# Patient Record
Sex: Male | Born: 1960 | Race: Black or African American | Hispanic: No | Marital: Single | Smoking: Never smoker
Health system: Southern US, Community
[De-identification: ages and names within clinical notes are randomized; demographics above are authoritative.]

## PROBLEM LIST (undated history)

## (undated) DIAGNOSIS — K219 Gastro-esophageal reflux disease without esophagitis: Secondary | ICD-10-CM

## (undated) DIAGNOSIS — I10 Essential (primary) hypertension: Secondary | ICD-10-CM

## (undated) DIAGNOSIS — G4730 Sleep apnea, unspecified: Secondary | ICD-10-CM

## (undated) HISTORY — DX: Gastro-esophageal reflux disease without esophagitis: K21.9

## (undated) HISTORY — DX: Essential (primary) hypertension: I10

---

## 2012-11-03 ENCOUNTER — Encounter: Payer: Self-pay | Admitting: Gastroenterology

## 2012-11-15 ENCOUNTER — Ambulatory Visit (INDEPENDENT_AMBULATORY_CARE_PROVIDER_SITE_OTHER): Payer: PRIVATE HEALTH INSURANCE | Admitting: Gastroenterology

## 2012-11-15 ENCOUNTER — Encounter: Payer: Self-pay | Admitting: Gastroenterology

## 2012-11-15 VITALS — BP 130/84 | HR 88 | Ht 68.5 in | Wt 221.4 lb

## 2012-11-15 DIAGNOSIS — R1310 Dysphagia, unspecified: Secondary | ICD-10-CM

## 2012-11-15 DIAGNOSIS — R066 Hiccough: Secondary | ICD-10-CM

## 2012-11-15 DIAGNOSIS — R197 Diarrhea, unspecified: Secondary | ICD-10-CM

## 2012-11-15 DIAGNOSIS — K219 Gastro-esophageal reflux disease without esophagitis: Secondary | ICD-10-CM

## 2012-11-15 MED ORDER — DEXLANSOPRAZOLE 60 MG PO CPDR: 60.0000 mg | DELAYED_RELEASE_CAPSULE | Freq: Every day | ORAL | Status: DC

## 2012-11-15 MED ORDER — HYOSCYAMINE SULFATE 0.125 MG SL SUBL: 0.1250 mg | SUBLINGUAL_TABLET | SUBLINGUAL | Status: DC | PRN

## 2012-11-15 NOTE — Patient Instructions (Addendum)
You have been scheduled for an endoscopy with propofol. Please follow written instructions given to you at your visit today. If you use inhalers (even only as needed), please bring them with you on the day of your procedure. Your physician has requested that you go to www.startemmi.com and enter the access code given to you at your visit today. This web site gives a general overview about your procedure. However, you should still follow specific instructions given to you by our office regarding your preparation for the procedure.  You watched a video today on acid reflux and information is below.  We have sent the following medications to your pharmacy for you to pick up at your convenience: Dexilant 60 mg, please take one capsule by mouth thirty minutes before breakfast once daily. Levsin, please take as directed   You have been scheduled for an esophageal manometry at [facility] on 11-28-12 at 9:15 am. Please arrive 30 minutes prior to your procedure for registration. You will need to go to outpatient registration (1st floor of the hospital) first. Make certain to bring your insurance cards as well as a complete list of medications.  Please remember the following:  1) Nothing to eat or drink after 12:00 midnight on the night before your test.  2) Hold all diabetic medications/insulin the morning of the test. You may eat and take  your medications after the test.  3) For 3 days prior to your test do not take: Dexilant, Prevacid, Nexium, Protonix,  Aciphex, Zegerid, Pantoprazole, Prilosec or omeprazole.  4) For 2 days prior to your test, do not take: Reglan, Tagamet, Zantac, Axid or Pepcid.  5) You MAY use an antacid such as Rolaids or Tums up to 12 hours prior to your test.  It will take at least 2 weeks to receive the results of this test from your physician. ------------------------------------------ ABOUT ESOPHAGEAL MANOMETRY Esophageal manometry (muh-NOM-uh-tree) is a test  that gauges how well your esophagus works. Your esophagus is the long, muscular tube that connects your throat to your stomach. Esophageal manometry measures the rhythmic muscle contractions (peristalsis) that occur in your esophagus when you swallow. Esophageal manometry also measures the coordination and force exerted by the muscles of your esophagus.  During esophageal manometry, a thin, flexible tube (catheter) that contains sensors is passed through your nose, down your esophagus and into your stomach. Esophageal manometry can be helpful in diagnosing some mostly uncommon disorders that affect your esophagus.  Why it's done Esophageal manometry is used to evaluate the movement (motility) of food through the esophagus and into the stomach. The test measures how well the circular bands of muscle (sphincters) at the top and bottom of your esophagus open and close, as well as the pressure, strength and pattern of the wave of esophageal muscle contractions that moves food along.  What you can expect Esophageal manometry is an outpatient procedure done without sedation. Most people tolerate it well. You may be asked to change into a hospital gown before the test starts.  During esophageal manometry  While you are sitting up, a member of your health care team sprays your throat with a numbing medication or puts numbing gel in your nose or both.  A catheter is guided through your nose into your esophagus. The catheter may be sheathed in a water-filled sleeve. It doesn't interfere with your breathing. However, your eyes may water, and you may gag. You may have a slight nosebleed from irritation.  After the catheter is in  place, you may be asked to lie on your back on an exam table, or you may be asked to remain seated.  You then swallow small sips of water. As you do, a computer connected to the catheter records the pressure, strength and pattern of your esophageal muscle contractions.  During the test,  you'll be asked to breathe slowly and smoothly, remain as still as possible, and swallow only when you're asked to do so.  A member of your health care team may move the catheter down into your stomach while the catheter continues its measurements.  The catheter then is slowly withdrawn. The test usually lasts 20 to 30 minutes.  After esophageal manometry  When your esophageal manometry is complete, you may return to your normal activities  This test typically takes 30-45 minutes to complete. ________________________________________________________________________________  Brent Mcconnell have been scheduled for a Barium Esophogram at [facility] (1st floor of the hospital) on 11-18-2012 at 10 am. Please arrive 15 minutes prior to your appointment for registration. Make certain not to have anything to eat or drink 6 hours prior to your test. If you need to reschedule for any reason, please contact radiology at [phone] to do so. __________________________________________________________________ A barium swallow is an examination that concentrates on views of the esophagus. This tends to be a double contrast exam (barium and two liquids which, when combined, create a gas to distend the wall of the oesophagus) or single contrast (non-ionic iodine based). The study is usually tailored to your symptoms so a good history is essential. Attention is paid during the study to the form, structure and configuration of the esophagus, looking for functional disorders (such as aspiration, dysphagia, achalasia, motility and reflux) EXAMINATION You may be asked to change into a gown, depending on the type of swallow being performed. A radiologist and radiographer will perform the procedure. The radiologist will advise you of the type of contrast selected for your procedure and direct you during the exam. You will be asked to stand, sit or lie in several different positions and to hold a small amount of fluid in your  mouth before being asked to swallow while the imaging is performed .In some instances you may be asked to swallow barium coated marshmallows to assess the motility of a solid food bolus. The exam can be recorded as a digital or video fluoroscopy procedure. POST PROCEDURE It will take 1-2 days for the barium to pass through your system. To facilitate this, it is important, unless otherwise directed, to increase your fluids for the next 24-48hrs and to resume your normal diet.  This test typically takes about 30 minutes to perform. __________________________________________________________________________________   _______________________________________________________________________________________________________________  Diet for Gastroesophageal Reflux Disease, Adult Reflux (acid reflux) is when acid from your stomach flows up into the esophagus. When acid comes in contact with the esophagus, the acid causes irritation and soreness (inflammation) in the esophagus. When reflux happens often or so severely that it causes damage to the esophagus, it is called gastroesophageal reflux disease (GERD). Nutrition therapy can help ease the discomfort of GERD. FOODS OR DRINKS TO AVOID OR LIMIT  Smoking or chewing tobacco. Nicotine is one of the most potent stimulants to acid production in the gastrointestinal tract.  Caffeinated and decaffeinated coffee and black tea.  Regular or low-calorie carbonated beverages or energy drinks (caffeine-free carbonated beverages are allowed).   Strong spices, such as black pepper, white pepper, red pepper, cayenne, curry powder, and chili powder.  Peppermint or spearmint.  Chocolate.  High-fat foods, including meats and fried foods. Extra added fats including oils, butter, salad dressings, and nuts. Limit these to less than 8 tsp per day.  Fruits and vegetables if they are not tolerated, such as citrus fruits or tomatoes.  Alcohol.  Any food that seems  to aggravate your condition. If you have questions regarding your diet, call your caregiver or a registered dietitian. OTHER THINGS THAT MAY HELP GERD INCLUDE:   Eating your meals slowly, in a relaxed setting.  Eating 5 to 6 small meals per day instead of 3 large meals.  Eliminating food for a period of time if it causes distress.  Not lying down until 3 hours after eating a meal.  Keeping the head of your bed raised 6 to 9 inches (15 to 23 cm) by using a foam wedge or blocks under the legs of the bed. Lying flat may make symptoms worse.  Being physically active. Weight loss may be helpful in reducing reflux in overweight or obese adults.  Wear loose fitting clothing EXAMPLE MEAL PLAN This meal plan is approximately 2,000 calories based on https://www.[facility].org/ meal planning guidelines. Breakfast   cup cooked oatmeal.  1 cup strawberries.  1 cup low-fat milk.  1 oz almonds. Snack  1 cup cucumber slices.  6 oz yogurt (made from low-fat or fat-free milk). Lunch  2 slice whole-wheat bread.  2 oz sliced [place].  2 tsp mayonnaise.  1 cup blueberries.  1 cup snap peas. Snack  6 whole-wheat crackers.  1 oz string cheese. Dinner   cup brown rice.  1 cup mixed veggies.  1 tsp olive oil.  3 oz grilled fish. Document Released: 04/20/2005 Document Revised: 07/13/2011 Document Reviewed: 03/06/2011 ExitCare Patient Information 2014 [employer]. __________________________________________________________________________________________________________________                                               We are excited to introduce MyChart, a new best-in-class service that provides you online access to important information in your electronic medical record. We want to make it easier for you to view your health information - all in one secure location - when and where you need it. We expect MyChart will enhance the quality of care and service we provide.  When you  register for MyChart, you can:    View your test results.    Request appointments and receive appointment reminders via email.    Request medication renewals.    View your medical history, allergies, medications and immunizations.    Communicate with your physician's office through a password-protected site.    Conveniently print information such as your medication lists.  To find out if MyChart is right for you, please talk to a member of our clinical staff today. We will gladly answer your questions about this free health and wellness tool.  If you are age 7 or older and want a member of your family to have access to your record, you must provide written consent by completing a proxy form available at our office. Please speak to our clinical staff about guidelines regarding accounts for patients younger than age 65.  As you activate your MyChart account and need any technical assistance, please call the MyChart technical support line at [phone]) or email your question to [email or web address]. If you email your question(s), please include your name, a  return phone number and the best time to reach you.  If you have non-urgent health-related questions, you can send a message to our office through MyChart at [email or web address]. If you have a medical emergency, call 911.  Thank you for using MyChart as your new health and wellness resource!   MyChart licensed from [employer],  [id number]. Patents Pending.

## 2012-11-15 NOTE — Progress Notes (Signed)
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  He has had a 2 year history of belching, burping, and acid reflux all as result of an episode of possible viral gastroenteritis onl 08/05/2010.  At that time he had nausea and vomiting and diarrhea, and has  had subsequent acid reflux refractory to standard PPI therapy.  He apparently underwent extensive evaluation in [place] including barium swallow, and endoscopy, and apparently was told that he had a hiatal hernia.  His current problem is solid food dysphagia  with every meal and with associated hiccups and regurgitation in the mid substernal area.  Despite these symptoms, he has had no anorexia, weight loss, and denies problems with liquids.  He does not have symptoms if he does not eat.  He currently is on over-the-counter Nexium with some control of his acidity.  He also relates that after a meal, he has rather urgent diarrhea without abdominal pain, melena or hematochezia..  Allegedly he had a negative colonoscopy 5 years ago in [place].  I have none of these records for review.  The patient relates that from October to March of last year he was free of symptoms when he was taking over-the-counter Prevacid.  He has no neuromuscular problems or other neurological issues, chronic headaches, associated skin rashes, arthritis, or any ENT problems.  He apparently has been emergency room on several occasions in the past with refractory hiccups.  I have reviewed this patient's present history, medical and surgical past history, allergies and medications.     ROS:   All systems were reviewed and are negative unless otherwise stated in the HPI.  No Known Allergies No outpatient prescriptions prior to visit.   No facility-administered medications prior to visit.   Past Medical History  Diagnosis Date  . GERD (gastroesophageal reflux disease)   . Hypertension     History reviewed. No pertinent past surgical history. History   Social History  . Marital Status: Legally Separated    Spouse Name: N/A    Number of Children: 4  . Years of Education: N/A   Occupational History  . Retired    Social History Main Topics  . Smoking status: Never Smoker   . Smokeless tobacco: Never Used  . Alcohol Use: No  . Drug Use: No  . Sexually Active: None   Other Topics Concern  . None   Social History Narrative  . None   Family History  Problem Relation Age of Onset  . Diabetes Mother   . Heart disease Mother   . Diabetes Father   . Heart disease Father        Physical Exam: Very strong and physically fit-appearing patient in no distress.  Blood pressure 130/84, pulse 88, and weight 221 with a BMI of 33.17.  Examination oral pharyngeal area shows some edema in the retropharyngeal area.  Examination the neck is otherwise unremarkable. General well developed well nourished patient in no acute distress, appearing their stated age Eyes PERRLA, no icterus, fundoscopic exam per opthamologist Skin no lesions noted Neck supple, no adenopathy, no thyroid enlargement, no tenderness Chest clear to percussion and auscultation Heart no significant murmurs, gallops or rubs noted Abdomen no hepatosplenomegaly masses or tenderness, BS normal.  Extremities no acute joint lesions, edema, phlebitis or evidence of cellulitis. Neurologic patient oriented x 3, cranial nerves intact, no focal neurologic deficits noted. Psychological mental status normal and normal affect.  Assessment  and plan: Probable severe acid reflux with associated peptic stricture the esophagus versus an esophageal motility disorder.  He did have a succussion splash noted on physical exam today.  I have placed him on stronger acid suppression with Dexilant 60 mg every morning, and leading try when necessary sublingual Levsin.  I've schedule barium swallow, endoscopy, an esophageal  high-resolution manometry.  The patient relates his labs are been checked frequently he has had no evidence of liver function test abnormalities.  He denies use of alcohol, cigarettes, or steroids or other bodybuilding supplements.  Besides his GERDproblems, he has no other medical complaints except for vague history of possible sleep apnea and mild essential hypertension.  He may need further workup of his diarrhea, but is not interested in repeat colonoscopy.  In cases such as his, we have had some success in the past with baclofen therapy for refractory hiccups.  Encounter Diagnoses  Name Primary?  . GERD (gastroesophageal reflux disease) Yes  . Dysphagia, unspecified(787.20)

## 2012-11-16 ENCOUNTER — Telehealth: Payer: Self-pay | Admitting: *Deleted

## 2012-11-16 MED ORDER — OMEPRAZOLE 40 MG PO CPDR: 40.0000 mg | DELAYED_RELEASE_CAPSULE | Freq: Every day | ORAL | Status: DC

## 2012-11-16 NOTE — Telephone Encounter (Signed)
Dexilant is not covered by patients insurance   Sent Omeprazole  Left message for patient to call back

## 2012-11-18 ENCOUNTER — Ambulatory Visit (HOSPITAL_COMMUNITY)
Admission: RE | Admit: 2012-11-18 | Discharge: 2012-11-18 | Disposition: A | Discharge status: Home / Self Care | Payer: No Typology Code available for payment source | Source: Ambulatory Visit | Attending: Gastroenterology | Admitting: Gastroenterology

## 2012-11-18 DIAGNOSIS — K219 Gastro-esophageal reflux disease without esophagitis: Secondary | ICD-10-CM

## 2012-11-18 DIAGNOSIS — R1310 Dysphagia, unspecified: Secondary | ICD-10-CM | POA: Insufficient documentation

## 2012-11-24 ENCOUNTER — Telehealth: Payer: Self-pay | Admitting: *Deleted

## 2012-11-24 NOTE — Telephone Encounter (Signed)
Called pt to schedule his EM and he already has one scheduled for 11/28/12. Pt states he has all his instructions and will call for questions.

## 2012-11-24 NOTE — Telephone Encounter (Signed)
Message copied by Florene Glen on Thu Nov 24, 2012  3:40 PM ------      Message from: Jarold Motto, DAVID R      Created: Thu Nov 24, 2012  9:56 AM       He needs esophageal manometry ------

## 2012-11-28 ENCOUNTER — Ambulatory Visit (HOSPITAL_COMMUNITY)
Admission: RE | Admit: 2012-11-28 | Discharge: 2012-11-28 | Disposition: A | Discharge status: Home / Self Care | Payer: No Typology Code available for payment source | Source: Ambulatory Visit | Attending: Gastroenterology | Admitting: Gastroenterology

## 2012-11-28 ENCOUNTER — Encounter (HOSPITAL_COMMUNITY): Payer: Self-pay | Admitting: *Deleted

## 2012-11-28 ENCOUNTER — Encounter (HOSPITAL_COMMUNITY)
Admission: RE | Disposition: A | Discharge status: Home / Self Care | Payer: Self-pay | Source: Ambulatory Visit | Attending: Gastroenterology

## 2012-11-28 DIAGNOSIS — R1310 Dysphagia, unspecified: Secondary | ICD-10-CM | POA: Insufficient documentation

## 2012-11-28 DIAGNOSIS — K219 Gastro-esophageal reflux disease without esophagitis: Secondary | ICD-10-CM | POA: Insufficient documentation

## 2012-11-28 HISTORY — PX: ESOPHAGEAL MANOMETRY: SHX5429

## 2012-11-28 SURGERY — MANOMETRY, ESOPHAGUS
Anesthesia: Topical

## 2012-11-28 MED ORDER — LIDOCAINE VISCOUS 2 % MT SOLN
OROMUCOSAL | Status: AC
  Filled 2012-11-28: qty 15

## 2012-11-28 SURGICAL SUPPLY — 2 items
DRAPE UTILITY 15X26 W/TAPE STR (DRAPE) ×2 IMPLANT
GOWN PREVENTION PLUS LG XLONG (DISPOSABLE) ×2 IMPLANT

## 2012-11-29 ENCOUNTER — Telehealth: Payer: Self-pay | Admitting: *Deleted

## 2012-11-29 ENCOUNTER — Encounter (HOSPITAL_COMMUNITY): Payer: Self-pay | Admitting: Gastroenterology

## 2012-11-29 NOTE — Telephone Encounter (Signed)
Called patient to tell him that his esophageal manometry was normal  Had to leave message for patient to call office back Patient has an appointment tomorrow for a procedure

## 2012-11-30 ENCOUNTER — Ambulatory Visit (AMBULATORY_SURGERY_CENTER): Payer: No Typology Code available for payment source | Admitting: Gastroenterology

## 2012-11-30 ENCOUNTER — Encounter: Payer: Self-pay | Admitting: Gastroenterology

## 2012-11-30 VITALS — BP 140/76 | HR 72 | Resp 25 | Ht 68.0 in | Wt 221.0 lb

## 2012-11-30 DIAGNOSIS — K219 Gastro-esophageal reflux disease without esophagitis: Secondary | ICD-10-CM

## 2012-11-30 DIAGNOSIS — D130 Benign neoplasm of esophagus: Secondary | ICD-10-CM

## 2012-11-30 DIAGNOSIS — R066 Hiccough: Secondary | ICD-10-CM

## 2012-11-30 DIAGNOSIS — K2990 Gastroduodenitis, unspecified, without bleeding: Secondary | ICD-10-CM

## 2012-11-30 DIAGNOSIS — K2970 Gastritis, unspecified, without bleeding: Secondary | ICD-10-CM

## 2012-11-30 MED ORDER — SODIUM CHLORIDE 0.9 % IV SOLN: 500.0000 mL | INTRAVENOUS | Status: DC

## 2012-11-30 NOTE — Patient Instructions (Addendum)
YOU HAD AN ENDOSCOPIC PROCEDURE TODAY AT THE [facility]: Refer to the procedure report that was given to you for any specific questions about what was found during the examination.  If the procedure report does not answer your questions, please call your gastroenterologist to clarify.  If you requested that your care partner not be given the details of your procedure findings, then the procedure report has been included in a sealed envelope for you to review at your convenience later.  YOU SHOULD EXPECT: Some feelings of bloating in the abdomen. Passage of more gas than usual.  Walking can help get rid of the air that was put into your GI tract during the procedure and reduce the bloating.  DIET: Your first meal following the procedure should be a light meal and then it is ok to progress to your normal diet.  A half-sandwich or bowl of soup is an example of a good first meal.  Heavy or fried foods are harder to digest and may make you feel nauseous or bloated.  Likewise meals heavy in dairy and vegetables can cause extra gas to form and this can also increase the bloating.  Drink plenty of fluids but you should avoid alcoholic beverages for 24 hours.  ACTIVITY: Your care partner should take you home directly after the procedure.  You should plan to take it easy, moving slowly for the rest of the day.  You can resume normal activity the day after the procedure however you should NOT DRIVE or use heavy machinery for 24 hours (because of the sedation medicines used during the test).    SYMPTOMS TO REPORT IMMEDIATELY: A gastroenterologist can be reached at any hour.  During normal business hours, 8:30 AM to 5:00 PM Monday through Friday, call [phone].  After hours and on weekends, please call the GI answering service at [phone] who will take a message and have the physician on call contact you.   Following upper endoscopy (EGD)  Vomiting of blood or coffee ground material  New  chest pain or pain under the shoulder blades  Painful or persistently difficult swallowing  New shortness of breath  Fever of 100F or higher  Black, tarry-looking stools  FOLLOW UP:  Our staff will call the home number listed on your records the next business day following your procedure to check on you and address any questions or concerns that you may have at that time regarding the information given to you following your procedure. This is a courtesy call and so if there is no answer at the home number and we have not heard from you through the emergency physician on call, we will assume that you have returned to your regular daily activities without incident.  SIGNATURES/CONFIDENTIALITY: You and/or your care partner have signed paperwork which will be entered into your electronic medical record.  These signatures attest to the fact that that the information above on your After Visit Summary has been reviewed and is understood.  Full responsibility of the confidentiality of this discharge information lies with you and/or your care-partner.

## 2012-11-30 NOTE — Progress Notes (Signed)
Patient did not experience any of the following events: a burn prior to discharge; a fall within the facility; wrong site/side/patient/procedure/implant event; or a hospital transfer or hospital admission upon discharge from the facility. (G8907) Patient did not have preoperative order for IV antibiotic SSI prophylaxis. (G8918)  

## 2012-11-30 NOTE — Progress Notes (Signed)
Called to room to assist during endoscopic procedure.  Patient ID and intended procedure confirmed with present staff. Received instructions for my participation in the procedure from the performing physician.  

## 2012-11-30 NOTE — Op Note (Signed)
[facility] [address] [place], [address]   ENDOSCOPY PROCEDURE REPORT  PATIENT: Brent Mcconnell, Brent Mcconnell  MR#: [id number] BIRTHDATE: 1960-08-04 , 51  yrs. old GENDER: Male ENDOSCOPIST:David Hale Bogus, MD, [facility] REFERRED BY: PROCEDURE DATE:  11/30/2012 PROCEDURE:   EGD w/ biopsy and EGD w/ biopsy for H.pylori ASA CLASS:    Class I INDICATIONS: hiccups. MEDICATION: propofol (Diprivan) 200mg  IV TOPICAL ANESTHETIC:  DESCRIPTION OF PROCEDURE:   After the risks and benefits of the procedure were explained, informed consent was obtained.  The LB [id number]  endoscope was introduced through the mouth  and advanced to the second portion of the duodenum .  The instrument was slowly withdrawn as the mucosa was fully examined.      DUODENUM: The duodenal mucosa showed no abnormalities in the bulb and second portion of the duodenum.  STOMACH: There was mild antral gastropathy noted.  Cold forcep biopsies were taken at the antrum. CLO Bx. done....  ESOPHAGUS: The mucosa of the esophagus appeared normal. Biopsie done.   Retroflexed views revealed a small hiatal hernia.    The scope was then withdrawn from the patient and the procedure completed.  COMPLICATIONS: There were no complications.   ENDOSCOPIC IMPRESSION: 1.   The duodenal mucosa showed no abnormalities in the bulb and second portion of the duodenum 2.   There was mild antral gastropathy noted [T2] ...r/o H.pylori 3.   The mucosa of the esophagus appeared normal..r/o eosinophilic esophagitis  RECOMMENDATIONS: 1.  Await biopsy results 2.  Rx CLO if positive 3 . Consider baclofen Rx.   _______________________________ eSigned:  Mardella Layman, MD, [facility] 11/30/2012 2:24 PM   standard discharge

## 2012-12-01 ENCOUNTER — Telehealth: Payer: Self-pay

## 2012-12-01 LAB — HELICOBACTER PYLORI SCREEN-BIOPSY: UREASE: NEGATIVE

## 2012-12-01 NOTE — Telephone Encounter (Signed)
  Follow up Call-  Call back number 11/30/2012  Post procedure Call Back phone  # [phone]  Permission to leave phone message Yes     Patient questions:  Do you have a fever, pain , or abdominal swelling? no Pain Score  0 *  Have you tolerated food without any problems? yes  Have you been able to return to your normal activities? yes  Do you have any questions about your discharge instructions: Diet   no Medications  no Follow up visit  no  Do you have questions or concerns about your Care? no  Actions: * If pain score is 4 or above: No action needed, pain <4.  No problems per the pt. Maw

## 2012-12-05 ENCOUNTER — Encounter: Payer: Self-pay | Admitting: Gastroenterology

## 2013-04-12 ENCOUNTER — Encounter: Payer: Self-pay | Admitting: Gastroenterology

## 2013-06-02 ENCOUNTER — Encounter (HOSPITAL_COMMUNITY): Payer: Self-pay | Admitting: Emergency Medicine

## 2013-06-02 ENCOUNTER — Emergency Department (HOSPITAL_COMMUNITY): Payer: No Typology Code available for payment source

## 2013-06-02 ENCOUNTER — Emergency Department (HOSPITAL_COMMUNITY)
Admission: EM | Admit: 2013-06-02 | Discharge: 2013-06-02 | Disposition: A | Discharge status: Home / Self Care | Payer: No Typology Code available for payment source | Attending: Emergency Medicine | Admitting: Emergency Medicine

## 2013-06-02 DIAGNOSIS — S0120XA Unspecified open wound of nose, initial encounter: Secondary | ICD-10-CM | POA: Insufficient documentation

## 2013-06-02 DIAGNOSIS — Z79899 Other long term (current) drug therapy: Secondary | ICD-10-CM | POA: Insufficient documentation

## 2013-06-02 DIAGNOSIS — K219 Gastro-esophageal reflux disease without esophagitis: Secondary | ICD-10-CM | POA: Insufficient documentation

## 2013-06-02 DIAGNOSIS — S022XXA Fracture of nasal bones, initial encounter for closed fracture: Secondary | ICD-10-CM | POA: Insufficient documentation

## 2013-06-02 DIAGNOSIS — R040 Epistaxis: Secondary | ICD-10-CM | POA: Insufficient documentation

## 2013-06-02 DIAGNOSIS — I10 Essential (primary) hypertension: Secondary | ICD-10-CM | POA: Insufficient documentation

## 2013-06-02 DIAGNOSIS — R000 Tachycardia, unspecified: Secondary | ICD-10-CM | POA: Insufficient documentation

## 2013-06-02 MED ORDER — HYDROCODONE-ACETAMINOPHEN 5-325 MG PO TABS: 2.0000 | ORAL_TABLET | ORAL | Status: DC | PRN

## 2013-06-02 MED ORDER — OXYMETAZOLINE HCL 0.05 % NA SOLN
1.0000 | Freq: Once | NASAL | Status: AC
  Administered 2013-06-02: 1 via NASAL
  Filled 2013-06-02: qty 15

## 2013-06-02 MED ORDER — CEPHALEXIN 500 MG PO CAPS: 500.0000 mg | ORAL_CAPSULE | Freq: Four times a day (QID) | ORAL | Status: DC
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
## 2013-06-02 NOTE — Discharge Instructions (Signed)
1. Medications: Keflex, Vicodin, usual home medications 2. Treatment: rest, drink plenty of fluids, keep wound clean with warm soap and water, keep bandage dry 3. Follow Up: Please followup with Dr Erik Obey in 5 days for reevaluation and suture removal.   Facial Fracture A facial fracture is a break in one of the bones of your face. HOME CARE INSTRUCTIONS   Protect the injured part of your face until it is healed.  Do not participate in activities which give chance for re-injury until your doctor approves.  Gently wash and dry your face.  Wear head and facial protection while riding a bicycle, motorcycle, or snowmobile. SEEK MEDICAL CARE IF:   An oral temperature above 102 F (38.9 C) develops.  You have severe headaches or notice changes in your vision.  You have new numbness or tingling in your face.  You develop nausea (feeling sick to your stomach), vomiting or a stiff neck. SEEK IMMEDIATE MEDICAL CARE IF:   You develop difficulty seeing or experience double vision.  You become dizzy, lightheaded, or faint.  You develop trouble speaking, breathing, or swallowing.  You have a watery discharge from your nose or ear. MAKE SURE YOU:   Understand these instructions.  Will watch your condition.  Will get help right away if you are not doing well or get worse. Document Released: 04/20/2005 Document Revised: 07/13/2011 Document Reviewed: 12/08/2007 [facility] Patient Information 2014 [place].
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
## 2013-06-07 NOTE — ED Provider Notes (Signed)
Medical screening examination/treatment/procedure(s) were performed by non-physician practitioner and as supervising physician I was immediately available for consultation/collaboration.  EKG Interpretation   None       Rolland Porter, MD, Abram Sander   Janice Norrie, MD 06/07/13 [phone]

## 2013-06-14 ENCOUNTER — Encounter (HOSPITAL_COMMUNITY): Payer: Self-pay | Admitting: Pharmacy Technician

## 2013-06-15 ENCOUNTER — Encounter (HOSPITAL_COMMUNITY): Payer: Self-pay

## 2013-06-15 ENCOUNTER — Ambulatory Visit (HOSPITAL_COMMUNITY)
Admission: RE | Admit: 2013-06-15 | Discharge: 2013-06-15 | Disposition: A | Discharge status: Home / Self Care | Payer: No Typology Code available for payment source | Source: Ambulatory Visit | Attending: Anesthesiology | Admitting: Anesthesiology

## 2013-06-15 ENCOUNTER — Other Ambulatory Visit: Payer: Self-pay | Admitting: Otolaryngology

## 2013-06-15 ENCOUNTER — Other Ambulatory Visit (HOSPITAL_COMMUNITY): Payer: Self-pay | Admitting: *Deleted

## 2013-06-15 ENCOUNTER — Encounter (HOSPITAL_COMMUNITY)
Admission: RE | Admit: 2013-06-15 | Discharge: 2013-06-15 | Disposition: A | Discharge status: Home / Self Care | Payer: No Typology Code available for payment source | Source: Ambulatory Visit | Attending: Otolaryngology | Admitting: Otolaryngology

## 2013-06-15 DIAGNOSIS — R599 Enlarged lymph nodes, unspecified: Secondary | ICD-10-CM | POA: Insufficient documentation

## 2013-06-15 DIAGNOSIS — Z01818 Encounter for other preprocedural examination: Secondary | ICD-10-CM | POA: Insufficient documentation

## 2013-06-15 DIAGNOSIS — Z01810 Encounter for preprocedural cardiovascular examination: Secondary | ICD-10-CM | POA: Insufficient documentation

## 2013-06-15 DIAGNOSIS — Z01812 Encounter for preprocedural laboratory examination: Secondary | ICD-10-CM

## 2013-06-15 DIAGNOSIS — I10 Essential (primary) hypertension: Secondary | ICD-10-CM | POA: Insufficient documentation

## 2013-06-15 HISTORY — DX: Sleep apnea, unspecified: G47.30

## 2013-06-15 LAB — CBC
HCT: 45.5 % (ref 39.0–52.0)
Hemoglobin: 15.9 g/dL (ref 13.0–17.0)
MCH: 29.3 pg (ref 26.0–34.0)
MCHC: 34.9 g/dL (ref 30.0–36.0)
MCV: 83.9 fL (ref 78.0–100.0)
PLATELETS: 297 10*3/uL (ref 150–400)
RBC: 5.42 MIL/uL (ref 4.22–5.81)
RDW: 14.2 % (ref 11.5–15.5)
WBC: 4 10*3/uL (ref 4.0–10.5)

## 2013-06-15 LAB — BASIC METABOLIC PANEL
BUN: 8 mg/dL (ref 6–23)
CALCIUM: 8.9 mg/dL (ref 8.4–10.5)
CO2: 28 meq/L (ref 19–32)
CREATININE: 1.77 mg/dL — AB (ref 0.50–1.35)
Chloride: 94 mEq/L — ABNORMAL LOW (ref 96–112)
GFR calc Af Amer: 50 mL/min — ABNORMAL LOW (ref >90)
GFR calc non Af Amer: 43 mL/min — ABNORMAL LOW (ref >90)
Glucose, Bld: 79 mg/dL (ref 70–99)
Potassium: 4.1 mEq/L (ref 3.7–5.3)
SODIUM: 135 meq/L — AB (ref 137–147)

## 2013-06-15 NOTE — Progress Notes (Signed)
Called Dr. Victorio Palm office for pre-op orders. Spoke with [place] and she states she will get them in.

## 2013-06-15 NOTE — Pre-Procedure Instructions (Signed)
Senon K Barrilleaux  06/15/2013   Your procedure is scheduled on:  Friday, June 16, 2013 at 10:45 AM.   Report to [facility] Entrance "A" Admitting Office at 8:45 AM.   Call this number if you have problems the morning of surgery: [phone]   Remember:   Do not eat food or drink liquids after midnight tonight.   Take these medicines the morning of surgery with A SIP OF WATER: amLODipine (NORVASC), HYDROcodone-acetaminophen (NORCO/VICODIN) - if needed.     Do not wear jewelry.  Do not wear lotions, powders, or cologne. You may wear deodorant.  Men may shave face and neck.  Do not bring valuables to the hospital.  [facility] is not responsible                  for any belongings or valuables.               Contacts, dentures or bridgework may not be worn into surgery.  Leave suitcase in the car. After surgery it may be brought to your room.  For patients admitted to the hospital, discharge time is determined by your                treatment team.    Special Instructions: [facility] - Preparing for Surgery  Before surgery, you can play an important role.  Because skin is not sterile, your skin needs to be as free of germs as possible.  You can reduce the number of germs on you skin by washing with CHG (chlorahexidine gluconate) soap before surgery.  CHG is an antiseptic cleaner which kills germs and bonds with the skin to continue killing germs even after washing.  Please DO NOT use if you have an allergy to CHG or antibacterial soaps.  If your skin becomes reddened/irritated stop using the CHG and inform your nurse when you arrive at Short Stay.  Do not shave (including legs and underarms) for at least 48 hours prior to the first CHG shower.  You may shave your face.  Please follow these instructions carefully:   1.  Shower with CHG Soap the night before surgery and the                                morning of Surgery.  2.  If you choose to wash your hair, wash your  hair first as usual with your       normal shampoo.  3.  After you shampoo, rinse your hair and body thoroughly to remove the                      Shampoo.  4.  Use CHG as you would any other liquid soap.  You can apply chg directly       to the skin and wash gently with scrungie or a clean washcloth.  5.  Apply the CHG Soap to your body ONLY FROM THE NECK DOWN.        Do not use on open wounds or open sores.  Avoid contact with your eyes, ears, mouth and genitals (private parts).  Wash genitals (private parts) with your normal soap.  6.  Wash thoroughly, paying special attention to the area where your surgery        will be performed.  7.  Thoroughly rinse your body with warm water from the neck down.  8.    DO NOT shower/wash with your normal soap after using and rinsing off       the CHG Soap.  9.  Pat yourself dry with a clean towel.            10.  Wear clean pajamas.            11.  Place clean sheets on your bed the night of your first shower and do not        sleep with pets.  Day of Surgery  Do not apply any lotions/deoderants the morning of surgery.  Please wear clean clothes to the hospital/surgery center.     Please read over the following fact sheets that you were given: Pain Booklet, Coughing and Deep Breathing and Surgical Site Infection Prevention    

## 2013-06-15 NOTE — Pre-Procedure Instructions (Signed)
Brent Mcconnell  06/15/2013   Your procedure is scheduled on:  Friday, June 16, 2013 at 10:45 AM.   Report to [facility] Entrance "A" Admitting Office at 8:45 AM.   Call this number if you have problems the morning of surgery: [phone]   Remember:   Do not eat food or drink liquids after midnight tonight.   Take these medicines the morning of surgery with A SIP OF WATER: amLODipine (NORVASC), HYDROcodone-acetaminophen (NORCO/VICODIN) - if needed.     Do not wear jewelry.  Do not wear lotions, powders, or cologne. You may wear deodorant.  Men may shave face and neck.  Do not bring valuables to the hospital.  [facility] is not responsible                  for any belongings or valuables.               Contacts, dentures or bridgework may not be worn into surgery.  Leave suitcase in the car. After surgery it may be brought to your room.  For patients admitted to the hospital, discharge time is determined by your                treatment team.    Special Instructions: [facility] - Preparing for Surgery  Before surgery, you can play an important role.  Because skin is not sterile, your skin needs to be as free of germs as possible.  You can reduce the number of germs on you skin by washing with CHG (chlorahexidine gluconate) soap before surgery.  CHG is an antiseptic cleaner which kills germs and bonds with the skin to continue killing germs even after washing.  Please DO NOT use if you have an allergy to CHG or antibacterial soaps.  If your skin becomes reddened/irritated stop using the CHG and inform your nurse when you arrive at Short Stay.  Do not shave (including legs and underarms) for at least 48 hours prior to the first CHG shower.  You may shave your face.  Please follow these instructions carefully:   1.  Shower with CHG Soap the night before surgery and the                                morning of Surgery.  2.  If you choose to wash your hair, wash your  hair first as usual with your       normal shampoo.  3.  After you shampoo, rinse your hair and body thoroughly to remove the                      Shampoo.  4.  Use CHG as you would any other liquid soap.  You can apply chg directly       to the skin and wash gently with scrungie or a clean washcloth.  5.  Apply the CHG Soap to your body ONLY FROM THE NECK DOWN.        Do not use on open wounds or open sores.  Avoid contact with your eyes, ears, mouth and genitals (private parts).  Wash genitals (private parts) with your normal soap.  6.  Wash thoroughly, paying special attention to the area where your surgery        will be performed.  7.  Thoroughly rinse your body with warm water from the neck down.  8.  DO NOT shower/wash with your normal soap after using and rinsing off       the CHG Soap.  9.  Pat yourself dry with a clean towel.            10.  Wear clean pajamas.            11.  Place clean sheets on your bed the night of your first shower and do not        sleep with pets.  Day of Surgery  Do not apply any lotions/deoderants the morning of surgery.  Please wear clean clothes to the hospital/surgery center.     Please read over the following fact sheets that you were given: Pain Booklet, Coughing and Deep Breathing and Surgical Site Infection Prevention

## 2013-06-15 NOTE — Progress Notes (Signed)
Called Dr. March Rummage office for most recent lab work (to include creatinine), most recent office visit and possible CXR. They verified that there is not old EKG. They will fax it to [place].

## 2013-06-15 NOTE — Progress Notes (Signed)
EKG abnormal. No old EKG's to compare. Pt states his PCP does not have an EKG. Pt denies sob, chest pain of any kind. Called and spoke with Dr. Ermalene Postin prior to pt leaving. He states he will look at the EKG, but pt can leave.

## 2013-06-15 NOTE — Progress Notes (Signed)
Dr. Ermalene Postin reviewed EKG, states it's ok. Told him that creatinine was elevated and that I was waiting for previous results from PCP office. He states that if creatinine elevation is new, to let him know. Creatinine level from previous was 1.61 in February 2014.

## 2013-06-16 ENCOUNTER — Encounter (HOSPITAL_BASED_OUTPATIENT_CLINIC_OR_DEPARTMENT_OTHER): Payer: Self-pay

## 2013-06-16 ENCOUNTER — Encounter (HOSPITAL_COMMUNITY): Payer: No Typology Code available for payment source | Admitting: Anesthesiology

## 2013-06-16 ENCOUNTER — Encounter (HOSPITAL_COMMUNITY)
Admission: RE | Disposition: A | Discharge status: Home / Self Care | Payer: Self-pay | Source: Ambulatory Visit | Attending: Otolaryngology

## 2013-06-16 ENCOUNTER — Encounter (HOSPITAL_COMMUNITY): Payer: Self-pay | Admitting: Anesthesiology

## 2013-06-16 ENCOUNTER — Ambulatory Visit (HOSPITAL_COMMUNITY)
Admission: RE | Admit: 2013-06-16 | Discharge: 2013-06-16 | Disposition: A | Discharge status: Home / Self Care | Payer: No Typology Code available for payment source | Source: Ambulatory Visit | Attending: Otolaryngology | Admitting: Otolaryngology

## 2013-06-16 ENCOUNTER — Ambulatory Visit (HOSPITAL_BASED_OUTPATIENT_CLINIC_OR_DEPARTMENT_OTHER): Admit: 2013-06-16 | Payer: Self-pay | Admitting: Otolaryngology

## 2013-06-16 ENCOUNTER — Ambulatory Visit (HOSPITAL_COMMUNITY): Payer: No Typology Code available for payment source | Admitting: Anesthesiology

## 2013-06-16 DIAGNOSIS — K219 Gastro-esophageal reflux disease without esophagitis: Secondary | ICD-10-CM | POA: Insufficient documentation

## 2013-06-16 DIAGNOSIS — I10 Essential (primary) hypertension: Secondary | ICD-10-CM | POA: Insufficient documentation

## 2013-06-16 DIAGNOSIS — J342 Deviated nasal septum: Secondary | ICD-10-CM | POA: Insufficient documentation

## 2013-06-16 DIAGNOSIS — G4733 Obstructive sleep apnea (adult) (pediatric): Secondary | ICD-10-CM | POA: Insufficient documentation

## 2013-06-16 DIAGNOSIS — J343 Hypertrophy of nasal turbinates: Secondary | ICD-10-CM | POA: Insufficient documentation

## 2013-06-16 DIAGNOSIS — J3489 Other specified disorders of nose and nasal sinuses: Secondary | ICD-10-CM | POA: Insufficient documentation

## 2013-06-16 DIAGNOSIS — S022XXA Fracture of nasal bones, initial encounter for closed fracture: Secondary | ICD-10-CM | POA: Insufficient documentation

## 2013-06-16 HISTORY — PX: NASAL SEPTOPLASTY W/ TURBINOPLASTY: SHX2070

## 2013-06-16 HISTORY — PX: CLOSED REDUCTION NASAL FRACTURE: SHX5365

## 2013-06-16 SURGERY — SEPTOPLASTY, NOSE, WITH NASAL TURBINATE REDUCTION
Anesthesia: General | Site: Nose

## 2013-06-16 SURGERY — SEPTOPLASTY, NOSE, WITH NASAL TURBINATE REDUCTION
Anesthesia: General | Site: Nose | Laterality: Bilateral

## 2013-06-16 MED ORDER — LIDOCAINE-EPINEPHRINE 1 %-1:100000 IJ SOLN
INTRAMUSCULAR | Status: AC
  Filled 2013-06-16: qty 1

## 2013-06-16 MED ORDER — ARTIFICIAL TEARS OP OINT
TOPICAL_OINTMENT | OPHTHALMIC | Status: AC
  Filled 2013-06-16: qty 3.5

## 2013-06-16 MED ORDER — ROCURONIUM BROMIDE 100 MG/10ML IV SOLN
INTRAVENOUS | Status: DC | PRN
  Administered 2013-06-16: 40 mg via INTRAVENOUS

## 2013-06-16 MED ORDER — ONDANSETRON HCL 4 MG/2ML IJ SOLN
INTRAMUSCULAR | Status: DC | PRN
  Administered 2013-06-16 (×2): 4 mg via INTRAVENOUS

## 2013-06-16 MED ORDER — 0.9 % SODIUM CHLORIDE (POUR BTL) OPTIME
TOPICAL | Status: DC | PRN
  Administered 2013-06-16: 1000 mL

## 2013-06-16 MED ORDER — LIDOCAINE HCL (CARDIAC) 20 MG/ML IV SOLN
INTRAVENOUS | Status: DC | PRN
  Administered 2013-06-16: 80 mg via INTRAVENOUS

## 2013-06-16 MED ORDER — DEXTROSE 5 % IV SOLN
2.0000 g | INTRAVENOUS | Status: DC | PRN
  Administered 2013-06-16: 2 g via INTRAVENOUS

## 2013-06-16 MED ORDER — GLYCOPYRROLATE 0.2 MG/ML IJ SOLN
INTRAMUSCULAR | Status: DC | PRN
  Administered 2013-06-16: .8 mg via INTRAVENOUS

## 2013-06-16 MED ORDER — PROPOFOL 10 MG/ML IV BOLUS
INTRAVENOUS | Status: DC | PRN
  Administered 2013-06-16: 250 mg via INTRAVENOUS

## 2013-06-16 MED ORDER — FENTANYL CITRATE 0.05 MG/ML IJ SOLN
INTRAMUSCULAR | Status: DC | PRN
  Administered 2013-06-16: 150 ug via INTRAVENOUS
  Administered 2013-06-16 (×2): 50 ug via INTRAVENOUS

## 2013-06-16 MED ORDER — GLYCOPYRROLATE 0.2 MG/ML IJ SOLN
INTRAMUSCULAR | Status: AC
  Filled 2013-06-16: qty 2

## 2013-06-16 MED ORDER — LACTATED RINGERS IV SOLN
INTRAVENOUS | Status: DC | PRN
  Administered 2013-06-16 (×2): via INTRAVENOUS

## 2013-06-16 MED ORDER — PROPOFOL 10 MG/ML IV BOLUS
INTRAVENOUS | Status: AC
  Filled 2013-06-16: qty 20

## 2013-06-16 MED ORDER — OXYMETAZOLINE HCL 0.05 % NA SOLN
NASAL | Status: DC | PRN
Start: 2013-06-16 — End: 2013-06-16
  Administered 2013-06-16: 1 via NASAL
  Administered 2013-06-16: 2 via NASAL

## 2013-06-16 MED ORDER — MIDAZOLAM HCL 2 MG/2ML IJ SOLN
INTRAMUSCULAR | Status: AC
  Filled 2013-06-16: qty 2

## 2013-06-16 MED ORDER — CEFAZOLIN SODIUM-DEXTROSE 2-3 GM-% IV SOLR: 2.0000 g | INTRAVENOUS | Status: DC

## 2013-06-16 MED ORDER — OXYMETAZOLINE HCL 0.05 % NA SOLN
NASAL | Status: AC
  Filled 2013-06-16: qty 15

## 2013-06-16 MED ORDER — CEFAZOLIN SODIUM-DEXTROSE 2-3 GM-% IV SOLR
INTRAVENOUS | Status: AC
  Filled 2013-06-16: qty 50

## 2013-06-16 MED ORDER — MUPIROCIN CALCIUM 2 % EX CREA
TOPICAL_CREAM | CUTANEOUS | Status: DC | PRN
  Administered 2013-06-16: 1 via TOPICAL

## 2013-06-16 MED ORDER — MIDAZOLAM HCL 5 MG/5ML IJ SOLN
INTRAMUSCULAR | Status: DC | PRN
  Administered 2013-06-16: 2 mg via INTRAVENOUS

## 2013-06-16 MED ORDER — ONDANSETRON HCL 4 MG/2ML IJ SOLN
INTRAMUSCULAR | Status: AC
  Filled 2013-06-16: qty 2

## 2013-06-16 MED ORDER — NEOSTIGMINE METHYLSULFATE 1 MG/ML IJ SOLN
INTRAMUSCULAR | Status: DC | PRN
  Administered 2013-06-16: 5 mg via INTRAVENOUS

## 2013-06-16 MED ORDER — DEXAMETHASONE SODIUM PHOSPHATE 10 MG/ML IJ SOLN
INTRAMUSCULAR | Status: AC
  Filled 2013-06-16: qty 1

## 2013-06-16 MED ORDER — HYDROMORPHONE HCL PF 1 MG/ML IJ SOLN: 0.2500 mg | INTRAMUSCULAR | Status: DC | PRN

## 2013-06-16 MED ORDER — MUPIROCIN CALCIUM 2 % EX CREA
TOPICAL_CREAM | CUTANEOUS | Status: AC
  Filled 2013-06-16: qty 15

## 2013-06-16 MED ORDER — DEXAMETHASONE SODIUM PHOSPHATE 10 MG/ML IJ SOLN: 10.0000 mg | Freq: Once | INTRAMUSCULAR | Status: DC

## 2013-06-16 MED ORDER — NEOSTIGMINE METHYLSULFATE 1 MG/ML IJ SOLN
INTRAMUSCULAR | Status: AC
  Filled 2013-06-16: qty 10

## 2013-06-16 MED ORDER — LACTATED RINGERS IV SOLN
INTRAVENOUS | Status: DC
  Administered 2013-06-16: 09:00:00 via INTRAVENOUS

## 2013-06-16 MED ORDER — HYDROCODONE-ACETAMINOPHEN 5-325 MG PO TABS: 1.0000 | ORAL_TABLET | Freq: Four times a day (QID) | ORAL | Status: AC | PRN

## 2013-06-16 MED ORDER — LIDOCAINE-EPINEPHRINE 1 %-1:100000 IJ SOLN
INTRAMUSCULAR | Status: DC | PRN
  Administered 2013-06-16: 20 mL

## 2013-06-16 MED ORDER — SUCCINYLCHOLINE CHLORIDE 20 MG/ML IJ SOLN
INTRAMUSCULAR | Status: DC | PRN
  Administered 2013-06-16: 120 mg via INTRAVENOUS

## 2013-06-16 MED ORDER — AMOXICILLIN-POT CLAVULANATE 500-125 MG PO TABS: 1.0000 | ORAL_TABLET | Freq: Two times a day (BID) | ORAL | Status: AC

## 2013-06-16 MED ORDER — ROCURONIUM BROMIDE 50 MG/5ML IV SOLN
INTRAVENOUS | Status: AC
  Filled 2013-06-16: qty 1

## 2013-06-16 MED ORDER — FENTANYL CITRATE 0.05 MG/ML IJ SOLN
INTRAMUSCULAR | Status: AC
  Filled 2013-06-16: qty 5

## 2013-06-16 SURGICAL SUPPLY — 33 items
BENZOIN TINCTURE PRP APPL 2/3 (GAUZE/BANDAGES/DRESSINGS) ×3 IMPLANT
CANISTER SUCTION 2500CC (MISCELLANEOUS) ×3 IMPLANT
CLOTH BEACON ORANGE TIMEOUT ST (SAFETY) IMPLANT
COVER SURGICAL LIGHT HANDLE (MISCELLANEOUS) ×3 IMPLANT
ELECT REM PT RETURN 9FT ADLT (ELECTROSURGICAL) ×3
ELECTRODE REM PT RTRN 9FT ADLT (ELECTROSURGICAL) ×2 IMPLANT
GAUZE SPONGE 2X2 8PLY STRL LF (GAUZE/BANDAGES/DRESSINGS) IMPLANT
GLOVE BIOGEL M 7.0 STRL (GLOVE) ×6 IMPLANT
GLOVE BIOGEL PI IND STRL 7.0 (GLOVE) ×2 IMPLANT
GLOVE BIOGEL PI INDICATOR 7.0 (GLOVE) ×1
GLOVE SURG SS PI 7.0 STRL IVOR (GLOVE) ×3 IMPLANT
GOWN STRL NON-REIN LRG LVL3 (GOWN DISPOSABLE) ×9 IMPLANT
KIT BASIN OR (CUSTOM PROCEDURE TRAY) ×3 IMPLANT
KIT ROOM TURNOVER OR (KITS) ×3 IMPLANT
NEEDLE HYPO 25GX1X1/2 BEV (NEEDLE) ×3 IMPLANT
NEEDLE HYPO 25X1 1.5 SAFETY (NEEDLE) ×3 IMPLANT
NS IRRIG 1000ML POUR BTL (IV SOLUTION) ×3 IMPLANT
PACK EENT II TURBAN DRAPE (CUSTOM PROCEDURE TRAY) ×3 IMPLANT
PAD ARMBOARD 7.5X6 YLW CONV (MISCELLANEOUS) ×3 IMPLANT
SPLINT NASAL DOYLE BI-VL (GAUZE/BANDAGES/DRESSINGS) ×3 IMPLANT
SPLINT NASAL THERMO PLAST (MISCELLANEOUS) IMPLANT
SPONGE GAUZE 2X2 STER 10/PKG (GAUZE/BANDAGES/DRESSINGS)
SPONGE NEURO XRAY DETECT 1X3 (DISPOSABLE) ×3 IMPLANT
STRIP CLOSURE SKIN 1/2X4 (GAUZE/BANDAGES/DRESSINGS) IMPLANT
SUT ETHILON 3 0 PS 1 (SUTURE) ×3 IMPLANT
SUT PLAIN 4 0 ~~LOC~~ 1 (SUTURE) ×3 IMPLANT
SUT SILK 2 0 FS (SUTURE) ×3 IMPLANT
SYR CONTROL 10ML LL (SYRINGE) ×3 IMPLANT
TOWEL OR 17X24 6PK STRL BLUE (TOWEL DISPOSABLE) ×3 IMPLANT
TOWEL OR 17X26 10 PK STRL BLUE (TOWEL DISPOSABLE) ×3 IMPLANT
TRAY ENT MC OR (CUSTOM PROCEDURE TRAY) ×3 IMPLANT
TUBE SALEM SUMP 16 FR W/ARV (TUBING) ×3 IMPLANT
TUBING EXTENTION W/L.L. (IV SETS) ×3 IMPLANT

## 2013-06-16 NOTE — Anesthesia Preprocedure Evaluation (Addendum)
Anesthesia Evaluation  Patient identified by MRN, date of birth, ID band Patient awake    Airway Mallampati: II      Dental   Pulmonary sleep apnea ,          Cardiovascular hypertension,     Neuro/Psych    GI/Hepatic Neg liver ROS, GERD-  ,  Endo/Other  negative endocrine ROS  Renal/GU negative Renal ROS     Musculoskeletal   Abdominal   Peds  Hematology   Anesthesia Other Findings   Reproductive/Obstetrics                          Anesthesia Physical Anesthesia Plan  ASA: III  Anesthesia Plan: General   Post-op Pain Management:    Induction: Intravenous  Airway Management Planned: Oral ETT  Additional Equipment:   Intra-op Plan:   Post-operative Plan: Extubation in OR  Informed Consent: I have reviewed the patients History and Physical, chart, labs and discussed the procedure including the risks, benefits and alternatives for the proposed anesthesia with the patient or authorized representative who has indicated his/her understanding and acceptance.   Dental advisory given  Plan Discussed with:   Anesthesia Plan Comments:         Anesthesia Quick Evaluation

## 2013-06-16 NOTE — Transfer of Care (Signed)
Immediate Anesthesia Transfer of Care Note  Patient: Brent Mcconnell  Procedure(s) Performed: Procedure(s) with comments: NASAL SEPTOPLASTY WITH BILATERAL INFERIOR TURBINATE REDUCTION (Bilateral) CLOSED REDUCTION NASAL FRACTURE (N/A) - With Thermo Plast Splint  Patient Location: PACU  Anesthesia Type:General  Level of Consciousness: awake, alert  and oriented  Airway & Oxygen Therapy: Patient Spontanous Breathing and Patient connected to face mask oxygen  Post-op Assessment: Report given to PACU RN  Post vital signs: Reviewed and stable  Complications: No apparent anesthesia complications

## 2013-06-16 NOTE — H&P (Signed)
Brent Mcconnell is an 53 y.o. male.   Chief Complaint: Nasal trauma and Obstruction HPI: HX of chronic nasal obstruction and OSA, wears CPAP. Recent nasal trauma with depressed nasal frx.  Past Medical History  Diagnosis Date  . GERD (gastroesophageal reflux disease)   . Hypertension   . Sleep apnea     uses CPAP    Past Surgical History  Procedure Laterality Date  . Esophageal manometry N/A 11/28/2012    Procedure: ESOPHAGEAL MANOMETRY (EM);  Surgeon: Sable Feil, MD;  Location: [facility];  Service: Endoscopy;  Laterality: N/A;    Family History  Problem Relation Age of Onset  . Diabetes Mother   . Heart disease Mother   . Diabetes Father   . Heart disease Father    Social History:  reports that he has never smoked. He has never used smokeless tobacco. He reports that he does not drink alcohol or use illicit drugs.  Allergies: No Known Allergies  No prescriptions prior to admission    Results for orders placed during the hospital encounter of 06/15/13 (from the past 48 hour(s))  BASIC METABOLIC PANEL     Status: Abnormal   Collection Time    06/15/13  1:30 PM      Result Value Ref Range   Sodium 135 (*) 137 - 147 mEq/L   Potassium 4.1  3.7 - 5.3 mEq/L   Chloride 94 (*) 96 - 112 mEq/L   CO2 28  19 - 32 mEq/L   Glucose, Bld 79  70 - 99 mg/dL   BUN 8  6 - 23 mg/dL   Creatinine, Ser 1.77 (*) 0.50 - 1.35 mg/dL   Calcium 8.9  8.4 - 10.5 mg/dL   GFR calc non Af Amer 43 (*) >90 mL/min   GFR calc Af Amer 50 (*) >90 mL/min   Comment: (NOTE)     The eGFR has been calculated using the CKD EPI equation.     This calculation has not been validated in all clinical situations.     eGFR's persistently <90 mL/min signify possible Chronic Kidney     Disease.  CBC     Status: None   Collection Time    06/15/13  1:30 PM      Result Value Ref Range   WBC 4.0  4.0 - 10.5 K/uL   RBC 5.42  4.22 - 5.81 MIL/uL   Hemoglobin 15.9  13.0 - 17.0 g/dL   HCT 45.5  39.0 - 52.0 %   MCV 83.9  78.0 - 100.0 fL   MCH 29.3  26.0 - 34.0 pg   MCHC 34.9  30.0 - 36.0 g/dL   RDW 14.2  11.5 - 15.5 %   Platelets 297  150 - 400 K/uL   Dg Chest 2 View  06/15/2013   CLINICAL DATA:  53 year old male preoperative study for nasal surgery. Hypertension. Initial encounter.  EXAM: CHEST  2 VIEW  COMPARISON:  None.  FINDINGS: Lung volumes within normal limits. Normal cardiac size. Mediastinal contours remarkable for evidence of bilateral hilar enlargement. Visualized tracheal air column is within normal limits. No pneumothorax, pulmonary edema, pleural effusion or confluent pulmonary opacity. No acute osseous abnormality identified.  IMPRESSION: 1. Bilateral hilar soft tissue enlargement, such as due to bilateral hilar lymphadenopathy. This finding is nonspecific. Is there a history of sarcoidosis? Chest CT (IV contrast preferred) would evaluate further. 2. Otherwise no acute cardiopulmonary abnormality.   Electronically Signed   By: Lezlie Octave.D.  On: 06/15/2013 14:27    Review of Systems  Constitutional: Negative.   HENT: Negative.        Hx of nasal trauma  Respiratory: Negative.   Cardiovascular: Negative.   Gastrointestinal: Negative.   Musculoskeletal: Negative.     There were no vitals taken for this visit. Physical Exam  Constitutional: He is oriented to person, place, and time. He appears well-developed and well-nourished.  HENT:  Nose: Mucosal edema, nasal deformity and septal deviation present.  Neck: Normal range of motion. Neck supple.  Cardiovascular: Normal rate.   Respiratory: Effort normal.  GI: Soft.  Musculoskeletal: Normal range of motion.  Neurological: He is alert and oriented to person, place, and time.     Assessment/Plan Adm for Closed reduction nasal frx, septoplasty and IT reduction.  [place], Dorean Daniello 06/16/2013, 8:47 AM

## 2013-06-16 NOTE — Anesthesia Postprocedure Evaluation (Signed)
  Anesthesia Post-op Note  Patient: Brent Mcconnell  Procedure(s) Performed: Procedure(s) with comments: NASAL SEPTOPLASTY WITH BILATERAL INFERIOR TURBINATE REDUCTION (Bilateral) CLOSED REDUCTION NASAL FRACTURE (N/A) - With Thermo Plast Splint  Patient Location: PACU  Anesthesia Type:General  Level of Consciousness: awake  Airway and Oxygen Therapy: Patient Spontanous Breathing  Post-op Pain: mild  Post-op Assessment: Post-op Vital signs reviewed  Post-op Vital Signs: Reviewed  Complications: No apparent anesthesia complications

## 2013-06-16 NOTE — Discharge Instructions (Signed)

## 2013-06-16 NOTE — Brief Op Note (Signed)
06/16/2013  10:47 AM  PATIENT:  Brent Mcconnell  53 y.o. male  PRE-OPERATIVE DIAGNOSIS:  Nasal Fracture/Deviated Septum  POST-OPERATIVE DIAGNOSIS:  Nasal Fracture/Deviated Septum  PROCEDURE:  Procedure(s) with comments: NASAL SEPTOPLASTY WITH BILATERAL INFERIOR TURBINATE REDUCTION (Bilateral) CLOSED REDUCTION NASAL FRACTURE (N/A) - With Thermo Plast Splint  SURGEON:  Surgeon(s) and Role:    * Jerrell Belfast, MD - Primary  PHYSICIAN ASSISTANT:   ASSISTANTS: none   ANESTHESIA:   general  EBL:  Total I/O In: 1000 [I.V.:1000] Out: - < 50cc  BLOOD ADMINISTERED:none  DRAINS: none   LOCAL MEDICATIONS USED:  LIDOCAINE  and Amount: 10 ml  SPECIMEN:  No Specimen  DISPOSITION OF SPECIMEN:  N/A  COUNTS:  YES  TOURNIQUET:  * No tourniquets in log *  DICTATION: .Other Dictation: Dictation Number [id number]  PLAN OF CARE: Discharge to home after PACU  PATIENT DISPOSITION:  PACU - hemodynamically stable.   Delay start of Pharmacological VTE agent (>24hrs) due to surgical blood loss or risk of bleeding: not applicable

## 2013-06-16 NOTE — Preoperative (Signed)
Beta Blockers   Reason not to administer Beta Blockers:Not Applicable 
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
## 2013-06-20 ENCOUNTER — Encounter (HOSPITAL_COMMUNITY): Payer: Self-pay | Admitting: Otolaryngology

## 2013-08-17 IMAGING — RF DG ESOPHAGUS
10 of 14 series · 13 of 24 positions shown · non-contrast
Comparison: None.

CLINICAL DATA: Dysphagia.

ESOPHOGRAM/BARIUM SWALLOW
TECHNIQUE: Combined double contrast and single contrast
examination performed using effervescent crystals, thick barium
liquid, and thin barium liquid.
Fluoroscopy time:  2 minutes, 54 seconds.

[Series 1: run · 1 of 2 slices shown (1 of 10)]
[im 1/2]
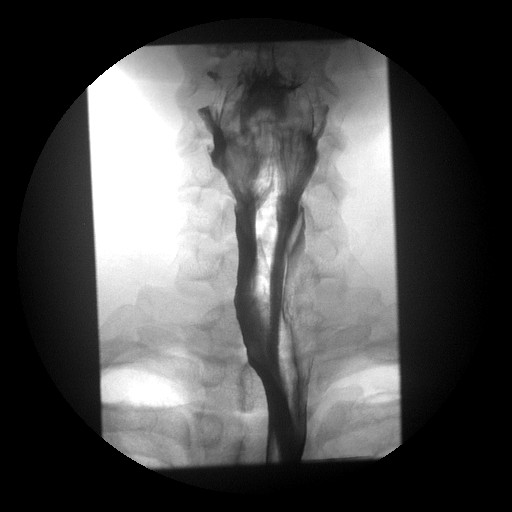

[Series 2: run · 3 of 7 slices shown (2 of 10)]
[im 2/7]
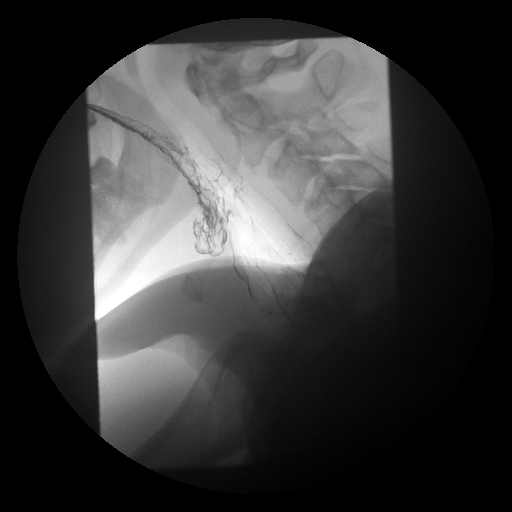
[im 4/7]
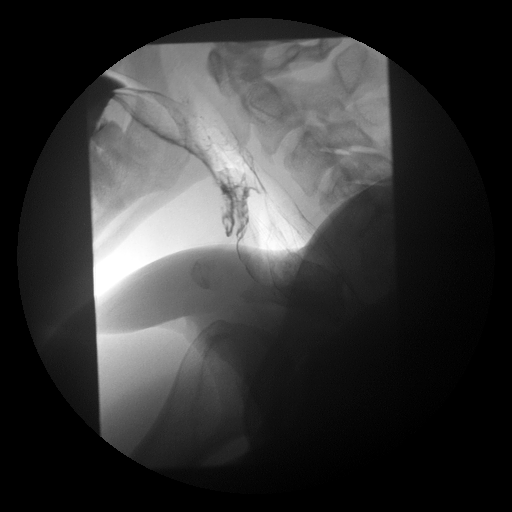
[im 6/7]
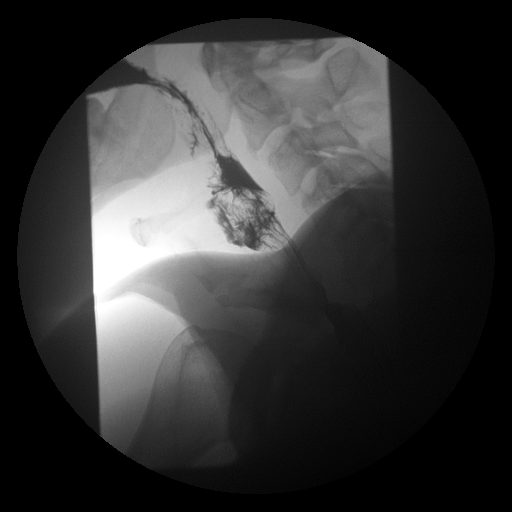

[Series 3: run · 1 of 1 slices shown (3 of 10)]
[im 1/1]
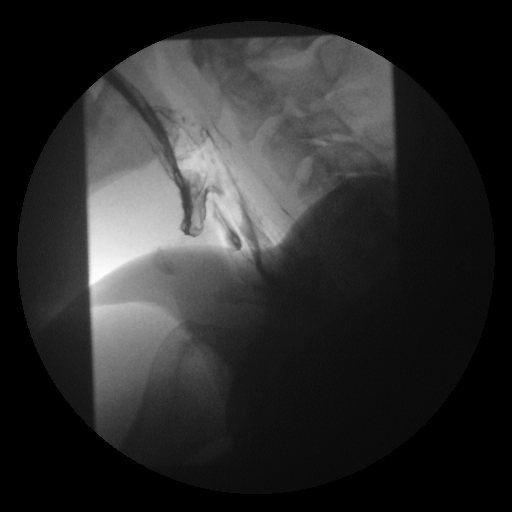

[Series 5: run · 1 of 2 slices shown (4 of 10)]
[im 1/2]
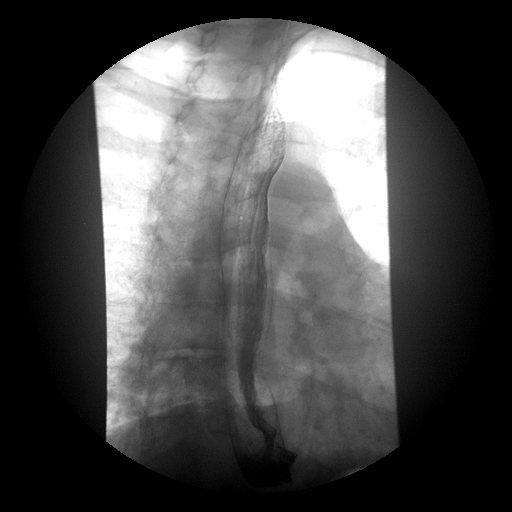

[Series 6: run · 2 of 3 slices shown (5 of 10)]
[im 1/3]
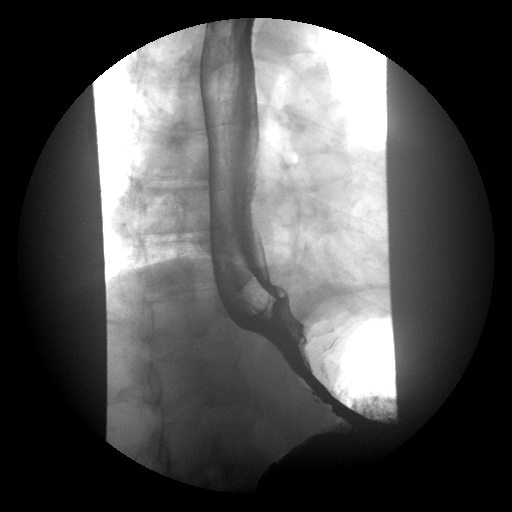
[im 2/3]
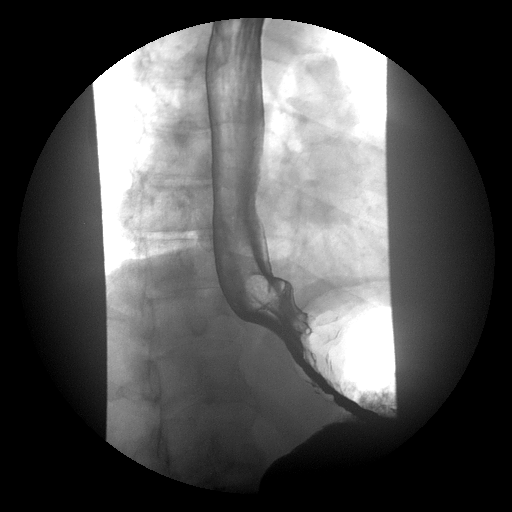

[Series 7: run · 1 of 1 slices shown (6 of 10)]
[im 1/1]
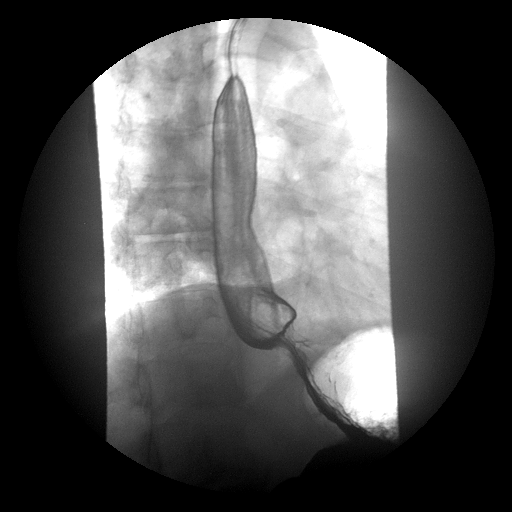

[Series 9: run · 1 of 1 slices shown (7 of 10)]
[im 1/1]
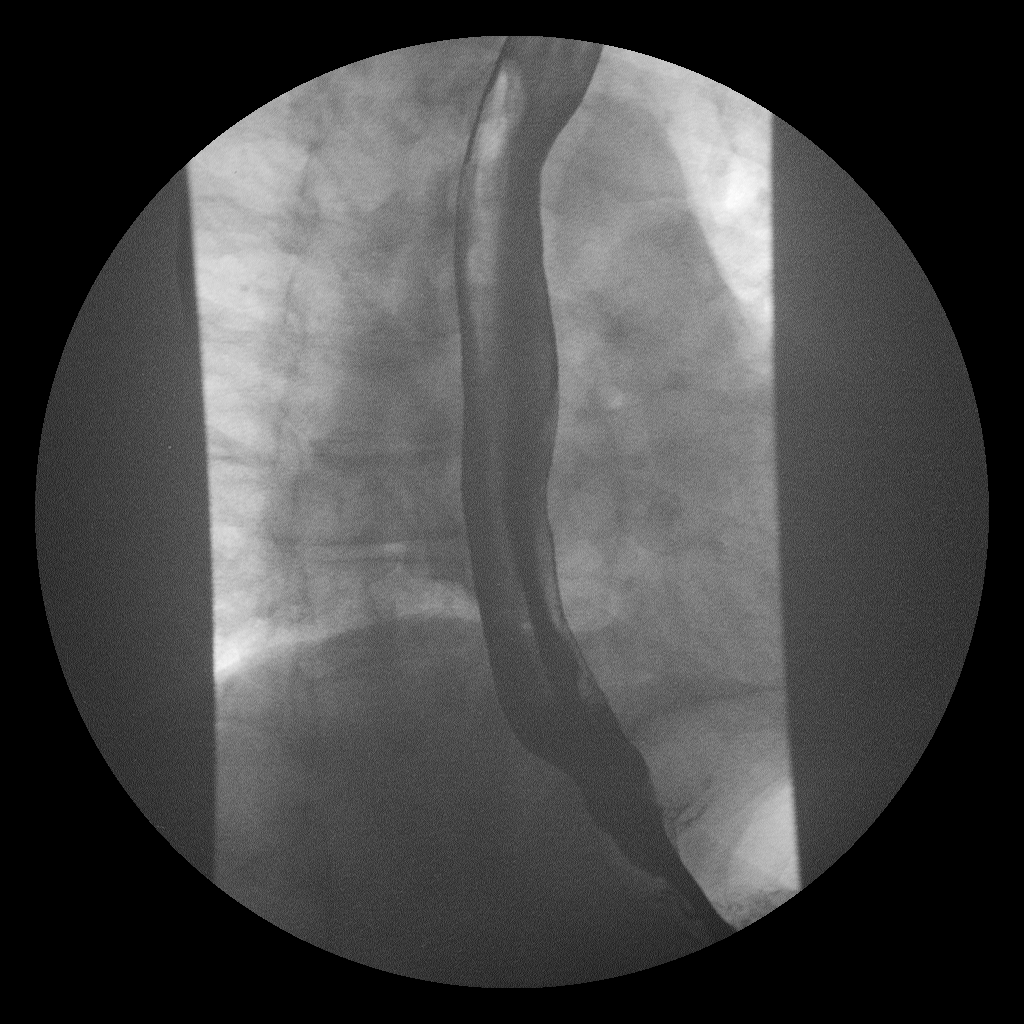

[Series 11: run · 1 of 2 slices shown (8 of 10)]
[im 1/2]
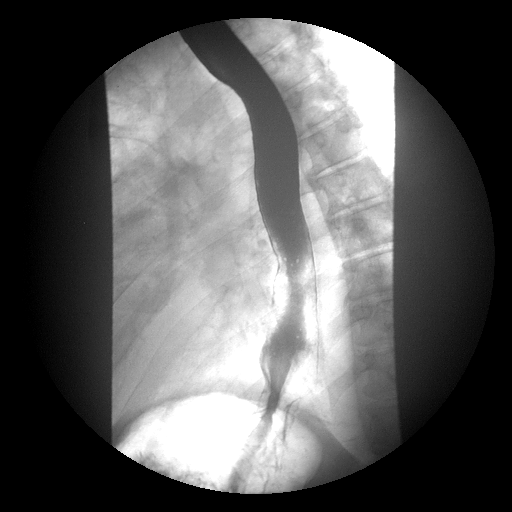

[Series 12: run · 1 of 1 slices shown (9 of 10)]
[im 1/1]
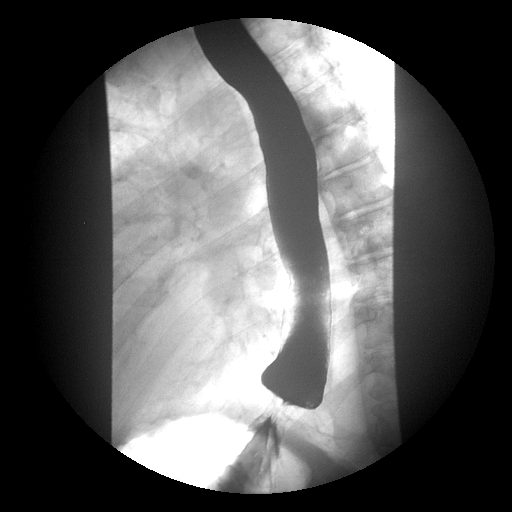

[Series 14: run · 1 of 1 slices shown (10 of 10)]
[im 1/1]
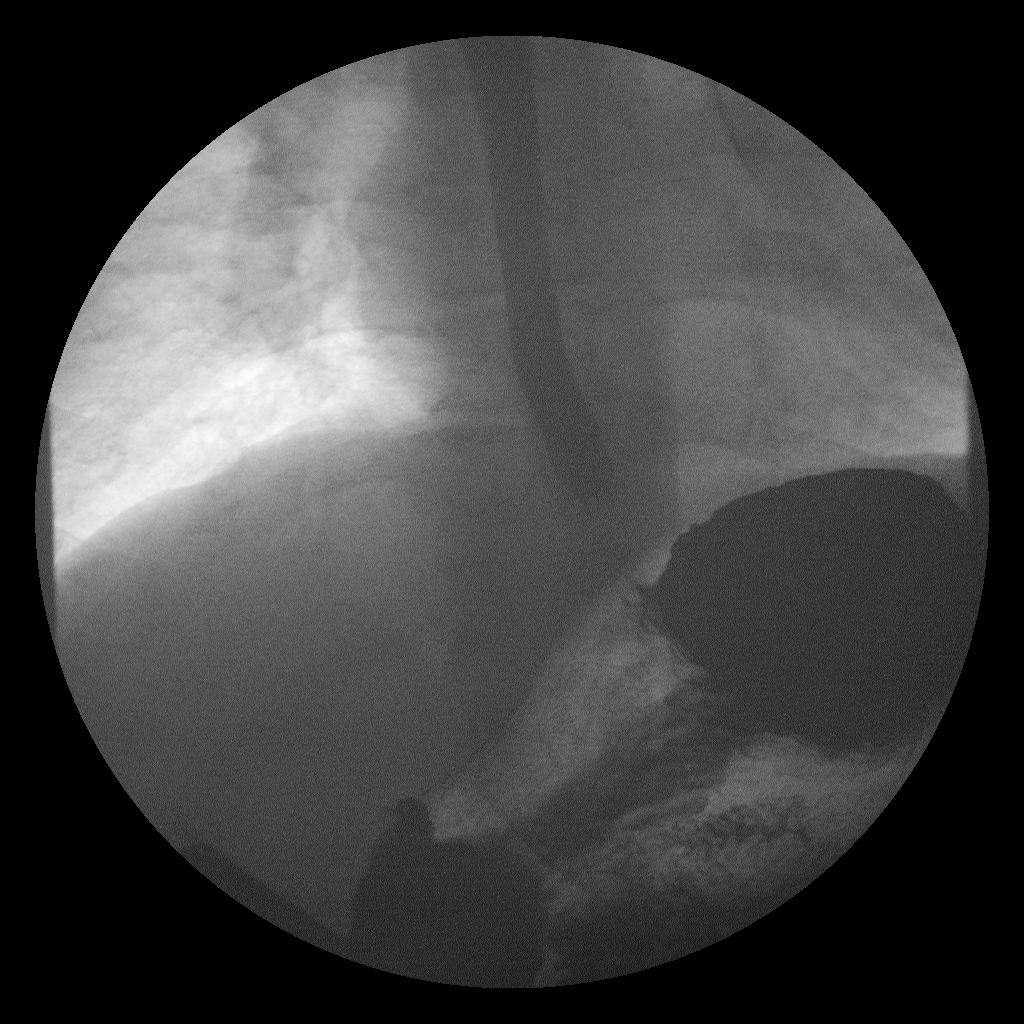

[13 of 24 positions shown; findings below may reference images not displayed]

FINDINGS: The pharynx appears normal.  No aspiration or
penetration is visualized. The esophagus demonstrates normal
caliber and normal appearing mucosa throughout without stricture,
ulceration or mass. Esophageal motility is unremarkable.  No hiatal
hernia is visualized on today's examination.  No gastroesophageal
reflux was elicited.  13 mm barium tablet passed easily into the
stomach.
IMPRESSION: Negative examination.  No hiatal hernia or gastroesophageal reflux
is demonstrated on the study.

## 2014-03-14 IMAGING — CR DG CHEST 2V
2 series · 2 of 2 positions shown · non-contrast
Comparison: None.

CLINICAL DATA: 52-year-old male preoperative study for nasal
surgery. Hypertension. Initial encounter.

EXAM:
CHEST  2 VIEW

[w chest pa]
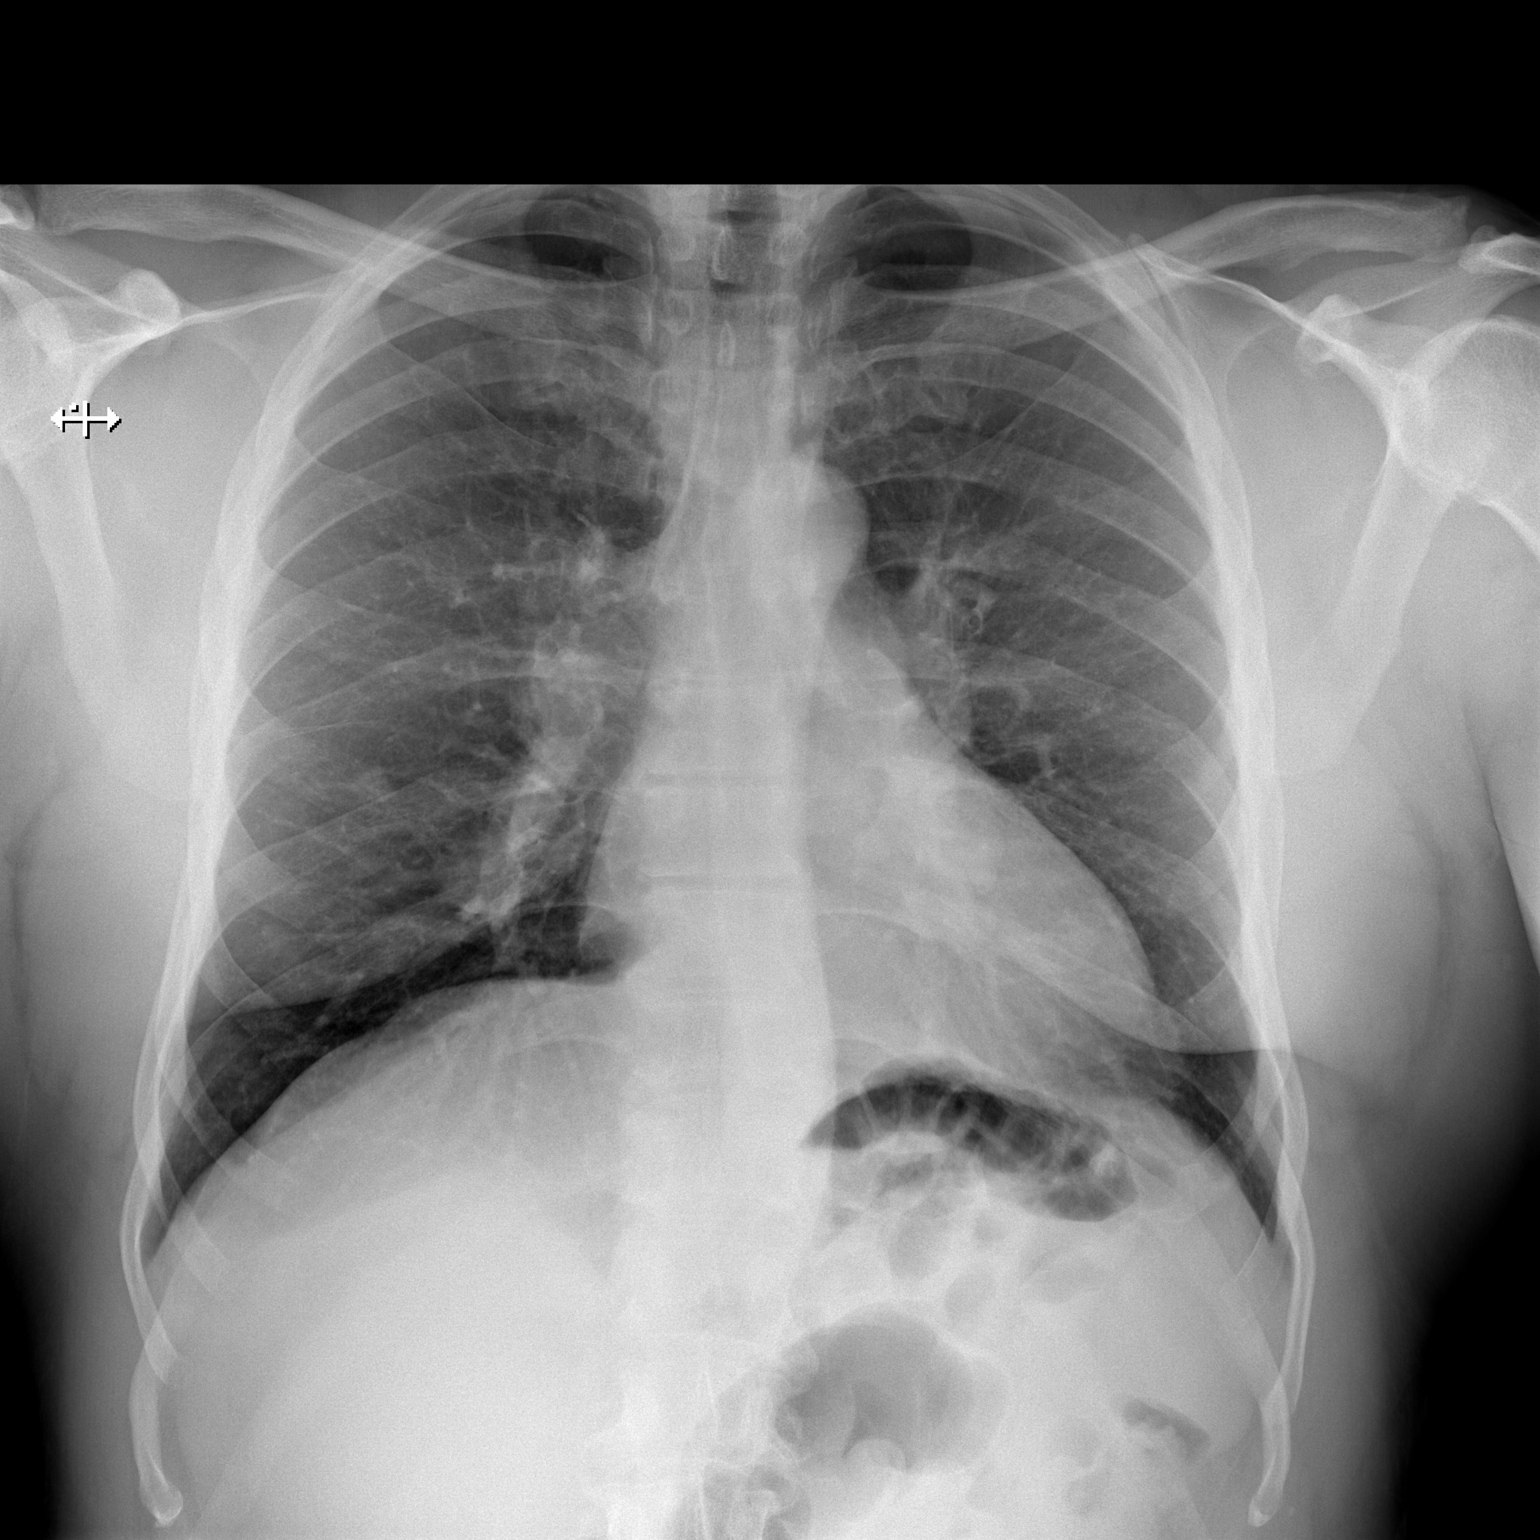

[w chest lat]
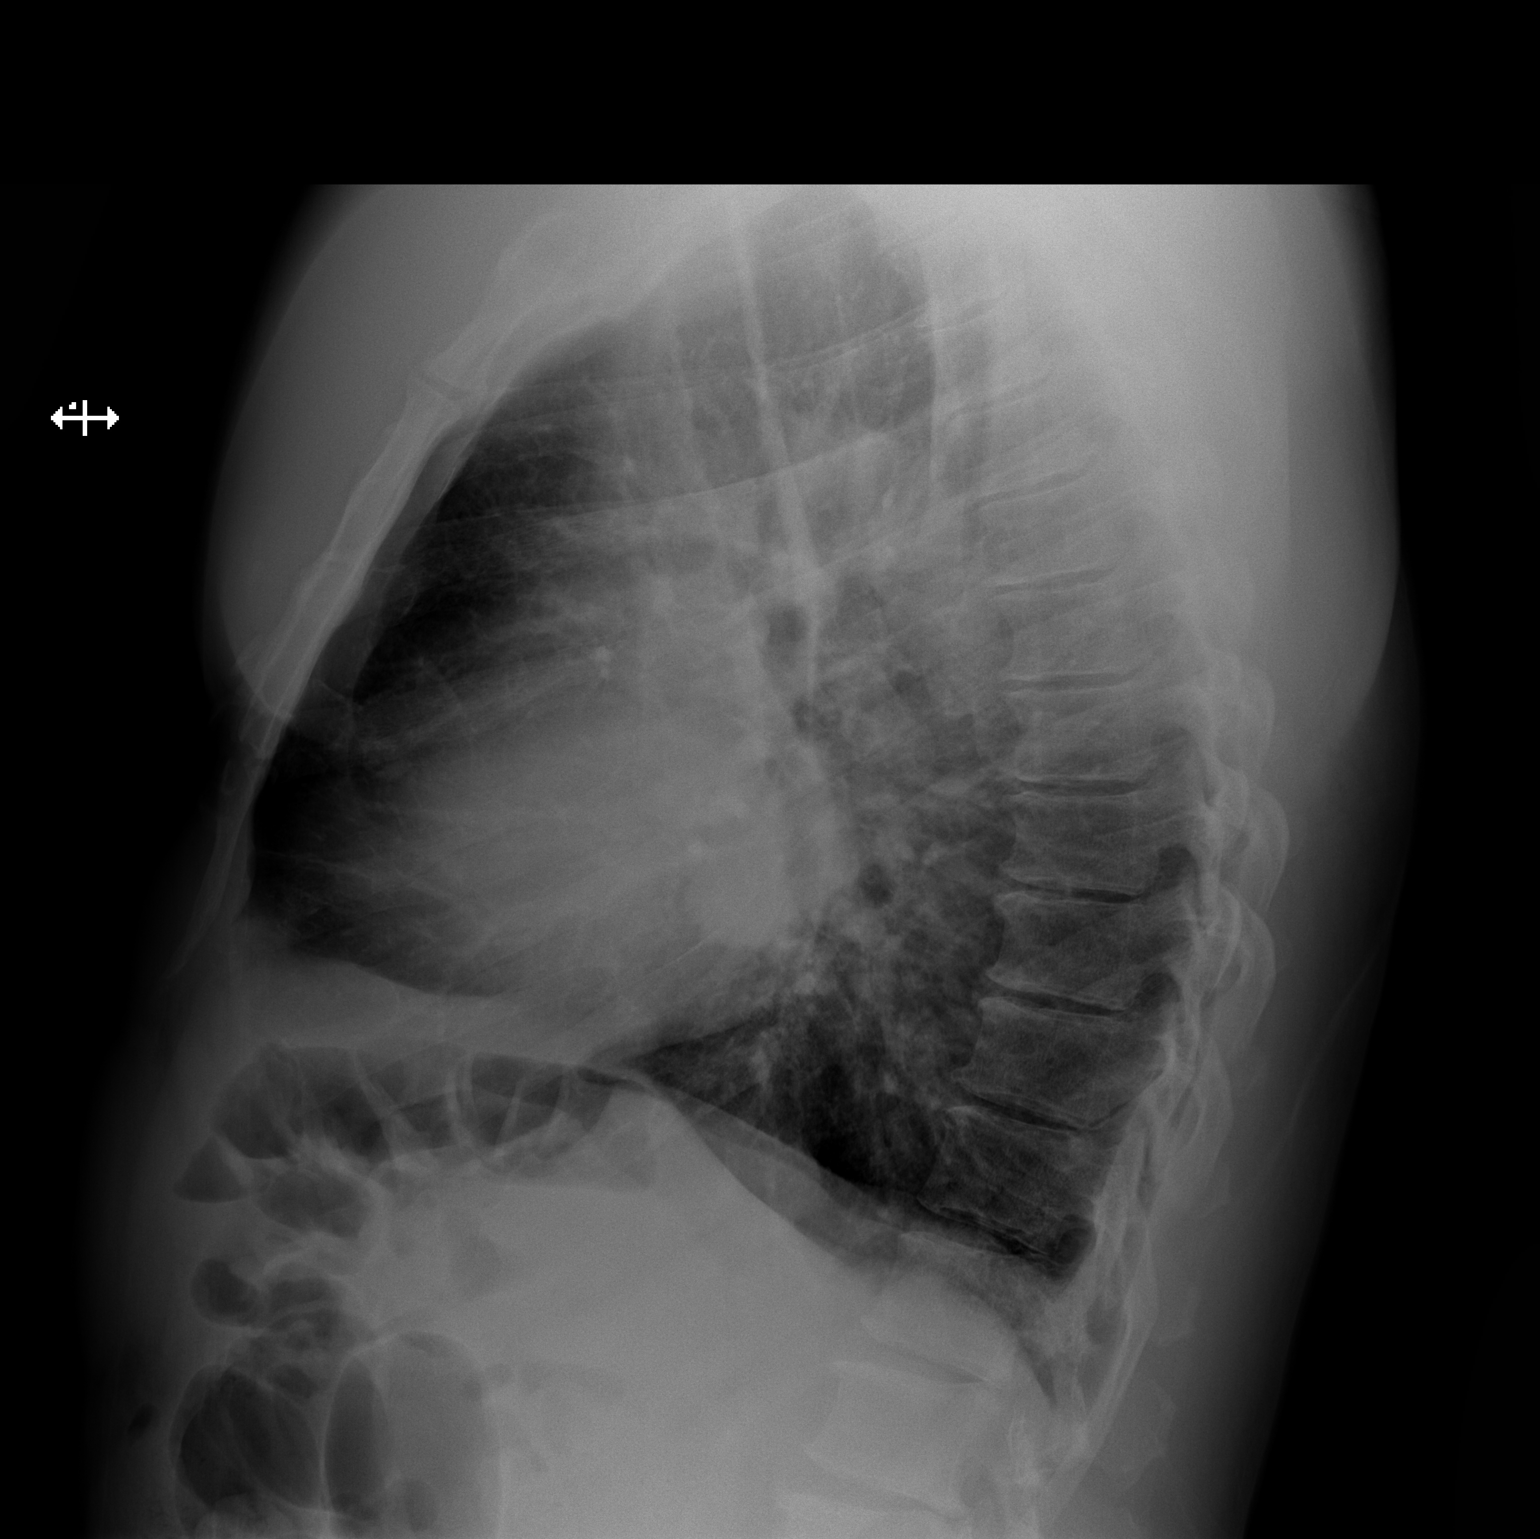

[2 of 2 positions shown; findings below may reference images not displayed]

FINDINGS: Lung volumes within normal limits. Normal cardiac size. Mediastinal
contours remarkable for evidence of bilateral hilar enlargement.
Visualized tracheal air column is within normal limits. No
pneumothorax, pulmonary edema, pleural effusion or confluent
pulmonary opacity. No acute osseous abnormality identified.
IMPRESSION: 1. Bilateral hilar soft tissue enlargement, such as due to bilateral
hilar lymphadenopathy. This finding is nonspecific. Is there a
history of sarcoidosis? Chest CT (IV contrast preferred) would
evaluate further.
2. Otherwise no acute cardiopulmonary abnormality.
